# Patient Record
Sex: Male | Born: 1985 | Race: Black or African American | Hispanic: No | Marital: Married | Smoking: Never smoker
Health system: Southern US, Community
[De-identification: ages and names within clinical notes are randomized; demographics above are authoritative.]

## PROBLEM LIST (undated history)

## (undated) HISTORY — PX: HERNIA REPAIR: SHX51

---

## 2005-12-28 ENCOUNTER — Emergency Department (HOSPITAL_COMMUNITY): Admission: AC | Admit: 2005-12-28 | Discharge: 2005-12-28 | Payer: Self-pay

## 2017-12-23 ENCOUNTER — Emergency Department (HOSPITAL_BASED_OUTPATIENT_CLINIC_OR_DEPARTMENT_OTHER)
Admission: EM | Admit: 2017-12-23 | Discharge: 2017-12-24 | Disposition: A | Discharge status: Home / Self Care | Payer: Self-pay | Attending: Emergency Medicine | Admitting: Emergency Medicine

## 2017-12-23 ENCOUNTER — Emergency Department (HOSPITAL_BASED_OUTPATIENT_CLINIC_OR_DEPARTMENT_OTHER): Payer: Self-pay

## 2017-12-23 ENCOUNTER — Encounter (HOSPITAL_BASED_OUTPATIENT_CLINIC_OR_DEPARTMENT_OTHER): Payer: Self-pay | Admitting: *Deleted

## 2017-12-23 ENCOUNTER — Other Ambulatory Visit: Payer: Self-pay

## 2017-12-23 DIAGNOSIS — T1490XA Injury, unspecified, initial encounter: Secondary | ICD-10-CM

## 2017-12-23 DIAGNOSIS — M84432A Pathological fracture, left ulna, initial encounter for fracture: Secondary | ICD-10-CM | POA: Insufficient documentation

## 2017-12-23 MED ORDER — OXYCODONE-ACETAMINOPHEN 5-325 MG PO TABS
1.0000 | ORAL_TABLET | Freq: Once | ORAL | Status: AC
  Administered 2017-12-23: 1 via ORAL
  Filled 2017-12-23: qty 1

## 2017-12-23 NOTE — ED Notes (Signed)
Patient transported to X-ray 

## 2017-12-23 NOTE — ED Triage Notes (Signed)
Pt states he slipped and fell while playing with his daughter and landed on left elbow and "heard a pop". Swelling and deformity noted. Able to wiggle digits

## 2017-12-23 NOTE — ED Notes (Signed)
ED Provider at bedside. 

## 2017-12-24 ENCOUNTER — Other Ambulatory Visit (HOSPITAL_COMMUNITY): Payer: Self-pay | Admitting: Sports Medicine

## 2017-12-24 DIAGNOSIS — M25522 Pain in left elbow: Secondary | ICD-10-CM

## 2017-12-24 DIAGNOSIS — S52035K Nondisplaced fracture of olecranon process with intraarticular extension of left ulna, subsequent encounter for closed fracture with nonunion: Secondary | ICD-10-CM

## 2017-12-24 MED ORDER — OXYCODONE-ACETAMINOPHEN 5-325 MG PO TABS: 1.0000 | ORAL_TABLET | Freq: Four times a day (QID) | ORAL | 0 refills | Status: AC | PRN

## 2017-12-24 MED ORDER — MORPHINE SULFATE (PF) 4 MG/ML IV SOLN
4.0000 mg | Freq: Once | INTRAVENOUS | Status: AC
  Administered 2017-12-24: 4 mg via INTRAMUSCULAR

## 2017-12-24 MED ORDER — MORPHINE SULFATE (PF) 4 MG/ML IV SOLN
INTRAVENOUS | Status: AC
  Filled 2017-12-24: qty 1

## 2017-12-24 NOTE — ED Notes (Signed)
Pt understood dc material. NAD noted. Script and follow up information given at dc

## 2017-12-24 NOTE — ED Provider Notes (Signed)
[facility] DEPART[facility] Provider Note   CSN: [id number] Arrival date & time: 12/23/17  2129     History   Chief Complaint Chief Complaint  Patient presents with  . Arm Injury    HPI Stephen Velasquez is a 32 y.o. male.  HPI  This is a 32 year old male who presents with left elbow pain.  Patient reports that he slipped and fell when playing with his son.  He landed on his left elbow.  He reports 8 out of 10 pain in the left elbow.  It is worse with movement.  He denies numbness or tingling into the hand.  He is right-handed.  He has not taken anything for his pain.  Denies other injury.  Denies hitting his head or loss of consciousness.  History reviewed. No pertinent past medical history.  There are no active problems to display for this patient.   Past Surgical History:  Procedure Laterality Date  . HERNIA REPAIR          Home Medications    Prior to Admission medications   Medication Sig Start Date End Date Taking? Authorizing Provider  oxyCODONE-acetaminophen (PERCOCET/ROXICET) 5-325 MG tablet Take 1 tablet by mouth every 6 (six) hours as needed for severe pain. 12/24/17   Sharrod Achille, Barbette Hair, MD    Family History No family history on file.  Social History Social History   Tobacco Use  . Smoking status: Never Smoker  . Smokeless tobacco: Never Used  Substance Use Topics  . Alcohol use: Yes  . Drug use: Never     Allergies   Patient has no known allergies.   Review of Systems Review of Systems  Musculoskeletal: Positive for joint swelling.       Left arm pain  Neurological: Negative for weakness and numbness.  All other systems reviewed and are negative.    Physical Exam Updated Vital Signs BP (!) 156/97 (BP Location: Right Arm)   Pulse 74   Temp 98.6 F (37 C) (Oral)   Resp 18   Ht 1.651 m (_0 )   Wt 79.4 kg   SpO2 98%   BMI 29.12 kg/m   Physical Exam Vitals signs and nursing note reviewed.  Constitutional:        Appearance: He is well-developed.  HENT:     Head: Normocephalic and atraumatic.  Cardiovascular:     Rate and Rhythm: Normal rate and regular rhythm.     Heart sounds: Normal heart sounds. No murmur.  Pulmonary:     Effort: Pulmonary effort is normal. No respiratory distress.     Breath sounds: Normal breath sounds. No wheezing.  Abdominal:     Palpations: Abdomen is soft.     Tenderness: There is no abdominal tenderness.  Musculoskeletal:     Comments: Limited range of motion of the left elbow secondary to pain, there is slight swelling, no significant deformity, 2+ radial pulse, neurovascularly intact distally  Skin:    General: Skin is warm and dry.  Neurological:     Mental Status: He is alert and oriented to person, place, and time.      ED Treatments / Results  Labs (all labs ordered are listed, but only abnormal results are displayed) Labs Reviewed - No data to display  EKG None  Radiology Dg Elbow 2 Views Left  Result Date: 12/23/2017 CLINICAL DATA:  Left elbow pain after fall. EXAM: LEFT ELBOW - 2 VIEW COMPARISON:  None. FINDINGS: There is a mildly displaced  fracture of the proximal ulnar metaphysis through a 1.7 x 1.3 cm lucent lesion. The radial head and neck are not well evaluated due to overlap with the ulna. Moderate elbow joint effusion. No dislocation. Joint spaces are preserved. Bone mineralization is normal. Soft tissue swelling about the elbow. IMPRESSION: 1. Mildly displaced pathologic fracture of the proximal ulnar metaphysis through a 1.7 x 1.3 cm lucent lesion. Differential diagnosis includes metastasis, multiple myeloma, enchondroma, or bone cyst. Recommend non-emergent MRI right elbow without contrast for further characterization. 2. Radial head and neck are not well evaluated due to overlap with the ulna. 3. Moderate joint effusion. Electronically Signed   By: Titus Dubin M.D.   On: 12/23/2017 23:27    Procedures Procedures (including critical  care time)  Medications Ordered in ED Medications  morphine 4 MG/ML injection (has no administration in time range)  oxyCODONE-acetaminophen (PERCOCET/ROXICET) 5-325 MG per tablet 1 tablet (1 tablet Oral Given 12/23/17 2322)  morphine 4 MG/ML injection 4 mg (4 mg Intramuscular Given 12/24/17 0005)     Initial Impression / Assessment and Plan / ED Course  I have reviewed the triage vital signs and the nursing notes.  Pertinent labs & imaging results that were available during my care of the patient were reviewed by me and considered in my medical decision making (see chart for details).     Patient presents with left elbow pain.  She is overall nontoxic-appearing and vital signs are reassuring.  X-ray shows a proximal ulnar fracture likely pathologic.  Patient was placed in a posterior splint.  He will need orthopedic follow-up and likely MRI.  He is neurovascular intact.  I did discuss with him the likelihood that this was a pathologic fracture and what that meant.  I sent a message to orthopedic on-call for follow-up.  Patient was given pain medication at discharge.  After history, exam, and medical workup I feel the patient has been appropriately medically screened and is safe for discharge home. Pertinent diagnoses were discussed with the patient. Patient was given return precautions.   Final Clinical Impressions(s) / ED Diagnoses   Final diagnoses:  Pathological fracture of left ulna, unspecified pathological cause, initial encounter    ED Discharge Orders         Ordered    oxyCODONE-acetaminophen (PERCOCET/ROXICET) 5-325 MG tablet  Every 6 hours PRN     12/24/17 0045           Merryl Hacker, MD 12/24/17 0106

## 2017-12-24 NOTE — Discharge Instructions (Addendum)
You were seen today for left elbow pain.  You have a fracture of the left ulna.  This is concerning for a pathologic fracture which means that you have a defect in the bone that was caused by something prior to her fall.  Follow-up with orthopedics.  Call for appointment.  Take pain medications as prescribed.

## 2017-12-29 ENCOUNTER — Ambulatory Visit (HOSPITAL_COMMUNITY)
Admission: RE | Admit: 2017-12-29 | Discharge: 2017-12-29 | Disposition: A | Discharge status: Home / Self Care | Payer: Self-pay | Source: Ambulatory Visit | Attending: Sports Medicine | Admitting: Sports Medicine

## 2017-12-29 DIAGNOSIS — M25522 Pain in left elbow: Secondary | ICD-10-CM

## 2017-12-29 DIAGNOSIS — S52035K Nondisplaced fracture of olecranon process with intraarticular extension of left ulna, subsequent encounter for closed fracture with nonunion: Secondary | ICD-10-CM

## 2017-12-29 DIAGNOSIS — X58XXXA Exposure to other specified factors, initial encounter: Secondary | ICD-10-CM | POA: Insufficient documentation

## 2017-12-29 DIAGNOSIS — M25422 Effusion, left elbow: Secondary | ICD-10-CM | POA: Insufficient documentation

## 2017-12-29 DIAGNOSIS — S53442A Ulnar collateral ligament sprain of left elbow, initial encounter: Secondary | ICD-10-CM | POA: Insufficient documentation

## 2017-12-29 DIAGNOSIS — S52202A Unspecified fracture of shaft of left ulna, initial encounter for closed fracture: Secondary | ICD-10-CM | POA: Insufficient documentation

## 2017-12-29 DIAGNOSIS — R936 Abnormal findings on diagnostic imaging of limbs: Secondary | ICD-10-CM | POA: Insufficient documentation

## 2020-08-04 IMAGING — MR MR ELBOW*L* W/O CM
6 series · 40 of 40 positions shown · non-contrast
Comparison: 12/23/2017

CLINICAL DATA: Locker non fracture with nonunion.

EXAM:
MRI OF THE LEFT ELBOW WITHOUT CONTRAST
TECHNIQUE: Multiplanar, multisequence MR imaging of the elbow was performed. No
intravenous contrast was administered.

[Series 4: T1 fat-sat · axial · left · 3.0mm · 0.55mm/px · z∈[-41,+52]mm · 6 of 25 slices shown (1 of 2)]
[im 1/25]
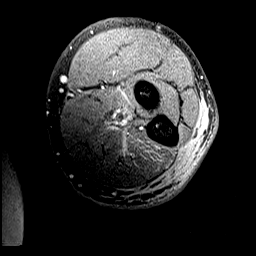
[im 5/25]
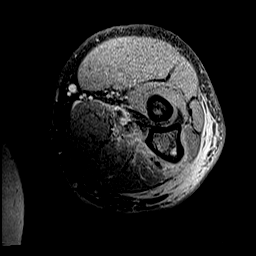
[im 10/25]
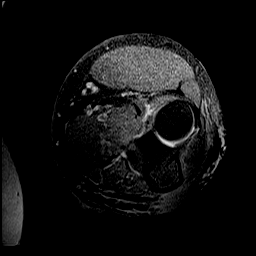
[im 15/25]
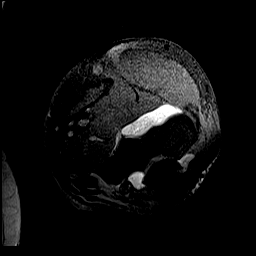
[im 20/25]
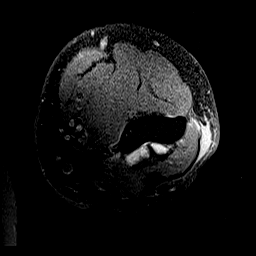
[im 25/25]
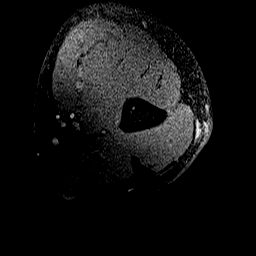

[Series 5: T1 fat-sat · sagittal · left · 3.0mm · 0.44mm/px · 6 of 24 slices shown (2 of 2)]
[im 1/24]
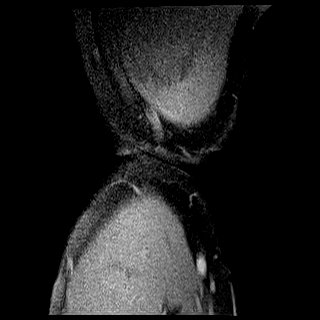
[im 5/24]
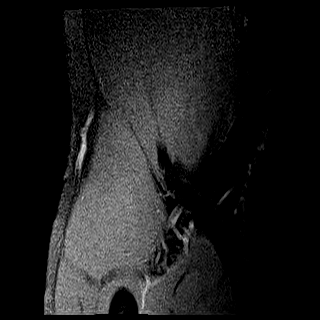
[im 10/24]
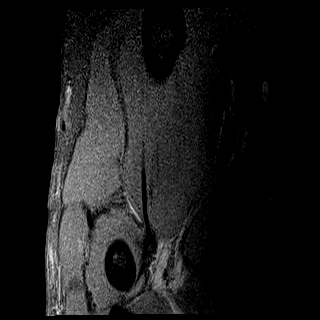
[im 14/24]
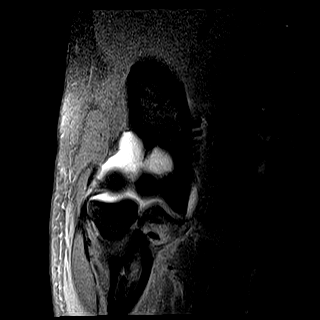
[im 19/24]
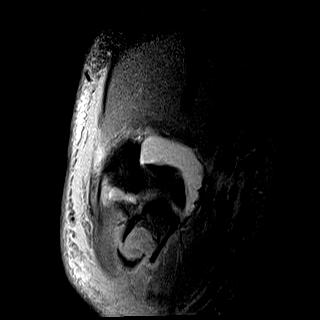
[im 24/24]
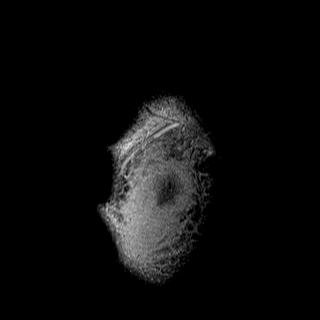

[Series 6: T1 · sagittal · left · 3.0mm · 0.44mm/px · 7 of 24 slices shown (1 of 4)]
[im 1/24]
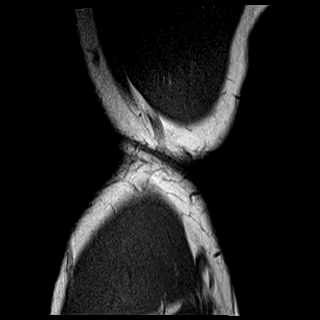
[im 4/24]
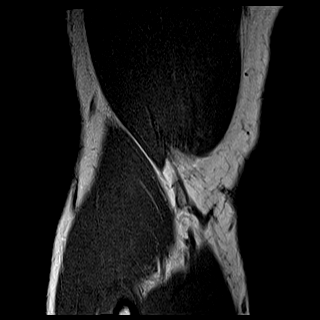
[im 8/24]
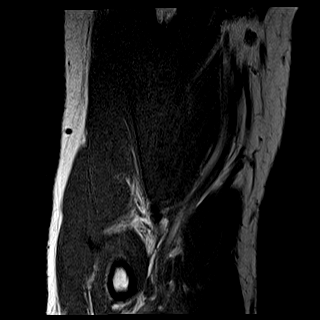
[im 12/24]
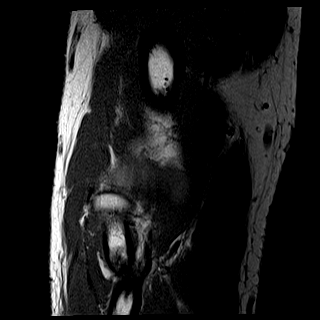
[im 16/24]
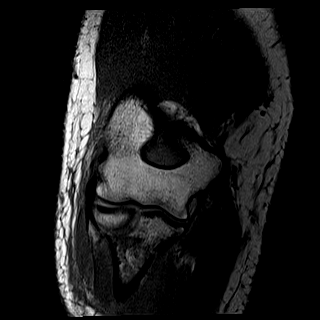
[im 20/24]
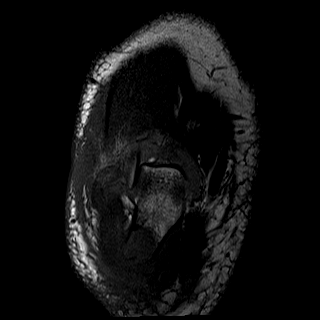
[im 24/24]
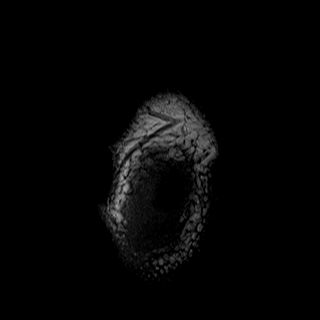

[Series 7: T1 · coronal · left · 3.0mm · 0.55mm/px · 7 of 25 slices shown (2 of 4)]
[im 1/25]
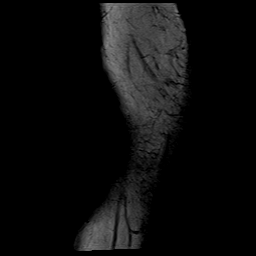
[im 5/25]
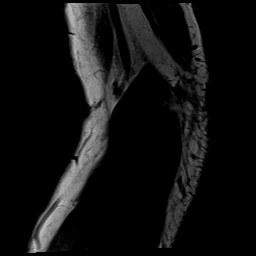
[im 9/25]
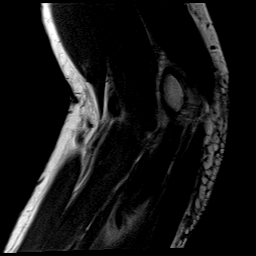
[im 13/25]
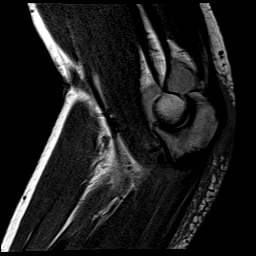
[im 17/25]
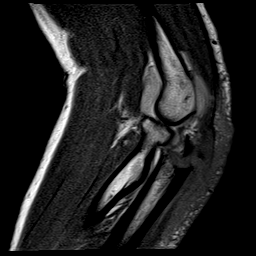
[im 21/25]
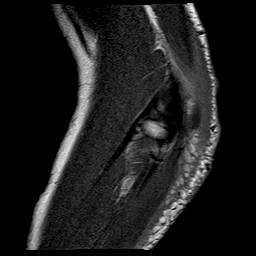
[im 25/25]
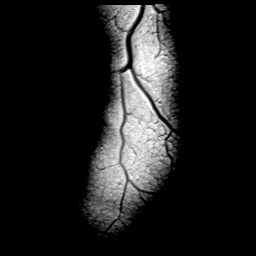

[Series 8: T1 · coronal · left · 3.0mm · 0.55mm/px · 7 of 24 slices shown (3 of 4)]
[im 1/24]
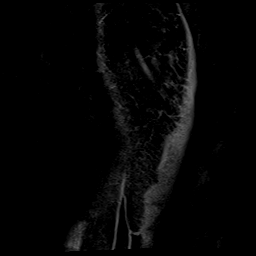
[im 4/24]
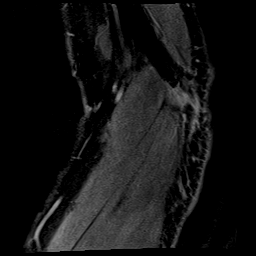
[im 8/24]
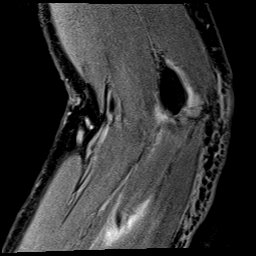
[im 12/24]
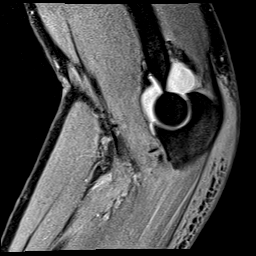
[im 16/24]
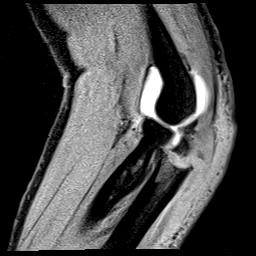
[im 20/24]
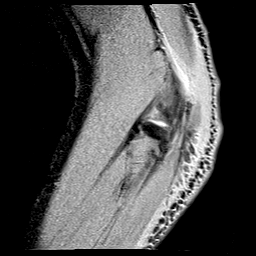
[im 24/24]
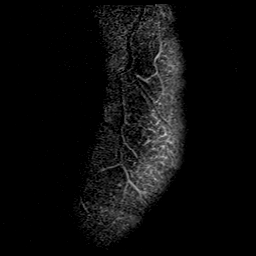

[Series 9: T1 · axial · left · 3.0mm · 0.55mm/px · z∈[-41,+52]mm · 7 of 25 slices shown (4 of 4)]
[im 1/25]
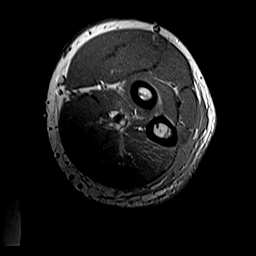
[im 5/25]
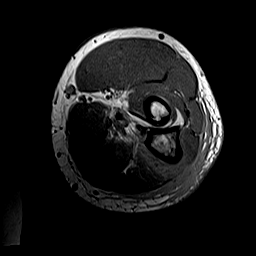
[im 9/25]
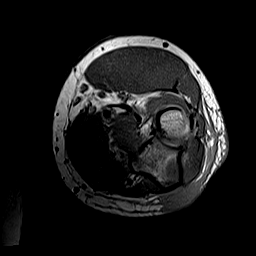
[im 13/25]
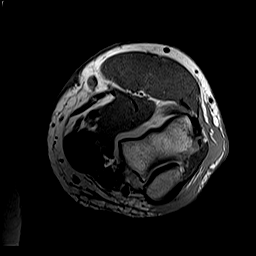
[im 17/25]
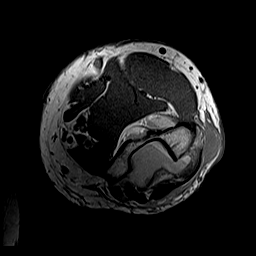
[im 21/25]
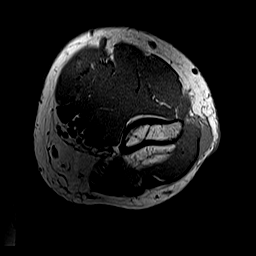
[im 25/25]
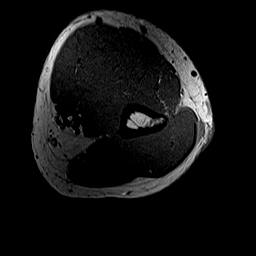

[40 of 40 positions shown; findings below may reference images not displayed]

FINDINGS: The patient was imaged while wearing a cast or splint, with the
elbow about 55 degrees of flexion. This nonstandard positioning
mildly adversely affects sensitivity and specificity.

TENDONS

Common forearm flexor origin: Unremarkable

Common forearm extensor origin: Unremarkable

Biceps: Unremarkable

Triceps: Unremarkable

LIGAMENTS

Medial stabilizers: The distal portion of the ulnar collateral
ligament posteriorly may be stripped slightly along the periosteum
of the ulna on images 18-19 of series 5, suggesting a small tear.

Lateral stabilizers: Indistinctness of the distal portion of the
lateral ulnar collateral ligament, which is at least sprain.

Cartilage: Unremarkable

Joint: Large joint effusion with a fluid-fluid level probably from
blood products

Cubital tunnel: Low-level of regional edema tracking along and
adjacent to the cubital tunnel without overt expansion or increased
signal of the ulnar nerve without well-defined impingement.

Bones: As noted on radiography there is a transverse fracture of the
ulnar metaphysis the which appears to partially extend through a
cm lesion of the base of the olecranon. Although assessment is
complicated by the presence of the fracture, on image [DATE] the
possibility of a lytic lesion with cortical destruction and
extension of extraosseous component of the mass along the
posteromedial margin of the anconeus muscle is raised. If there is a
sclerotic rim associated with this lesion at all it is very thin and
only seen distally for example on image [DATE].
IMPRESSION: 1. Transverse fracture of the proximal ulnar metaphysis with
confirmed posterior lytic lesion through which the fracture extends,
compatible with pathologic fracture. My sense is that there is
probably some cortical destruction associated with this fracture and
extraosseous extension along the margin of the anconeus muscle. Well
this would raise some concerns about the possibility of malignancy,
the patient's age is also a factor, and it is possible that a benign
process such as bone cyst might appear more aggressive when there is
an acute fracture complicating the appearance. I recommend survey of
the skeleton either by bone scan or conventional radiographic bone
survey study to assess for any other similar lesions.
2. The distal portion of the ulnar collateral ligament may be
stripped slightly along the periosteum of the ulna posteriorly,
suggesting a small tear.
3. Distal lateral ulnar collateral ligament is indistinct and at
least sprained.
4. Large elbow effusion with a fluid-fluid level internally probably
from blood products.
